# Patient Record
Sex: Female | Born: 1970 | Hispanic: Yes | Marital: Single | Smoking: Never smoker
Health system: Southern US, Community
[De-identification: ages and names within clinical notes are randomized; demographics above are authoritative.]

## PROBLEM LIST (undated history)

## (undated) DIAGNOSIS — I10 Essential (primary) hypertension: Secondary | ICD-10-CM

---

## 2006-09-08 ENCOUNTER — Observation Stay: Payer: Self-pay

## 2007-01-01 ENCOUNTER — Inpatient Hospital Stay: Payer: Self-pay | Admitting: Obstetrics and Gynecology

## 2010-05-06 ENCOUNTER — Encounter: Payer: Self-pay | Admitting: Obstetrics and Gynecology

## 2010-06-26 ENCOUNTER — Observation Stay: Payer: Self-pay | Admitting: Obstetrics and Gynecology

## 2010-09-28 ENCOUNTER — Inpatient Hospital Stay: Payer: Self-pay | Admitting: Obstetrics and Gynecology

## 2011-12-15 ENCOUNTER — Encounter: Payer: Self-pay | Admitting: Obstetrics & Gynecology

## 2011-12-26 ENCOUNTER — Encounter: Payer: Self-pay | Admitting: Obstetrics and Gynecology

## 2012-01-26 ENCOUNTER — Encounter: Payer: Self-pay | Admitting: Obstetrics and Gynecology

## 2012-02-23 ENCOUNTER — Encounter: Payer: Self-pay | Admitting: Maternal and Fetal Medicine

## 2012-03-22 ENCOUNTER — Encounter: Payer: Self-pay | Admitting: Maternal and Fetal Medicine

## 2014-12-28 NOTE — Consult Note (Signed)
Referral Information:   Reason for Referral Chronic Hypertension on labetolol in pregnancy now 24 1/7 here for follow up - additionally  Advanced Maternal Age, grand multiparity and depression    Referring Physician [facility]as Real[facility]les [facility]    Prenatal Hx Ms. Chip BoerVilleda Walter is a 44 year-old G5 P4004 at 3124 1/7 weeks who is referred for follow up of chronic hypertension in pregnancy. She increased her labetolol to 200 tid at the last visit and reports occasionally feeling dizzy after taking it if she hasn't eaten. She reports taking breakfast lunch and dinner soemtimes having a dose within 4 hours of the last one. She has had hypertension for approximately 3 years and denies evidence of end-organ dysfunction. She did well in her last pregnancy and did not develop preeclampsia.   Adv mat age-She had a first tirmester ultrasound performed [facility] on 10/17/11 which demonstrated an NT measurement of 1.9 mm and measurements consistent with her LMP.  She declined a first trimester screen.    She also has depression that is well-controlled on zoloft 50 mg qd  Pt seen with an interpreter.    Past Obstetrical Hx G5 P4004: 1994: spontaneous vaginal delivery at 7040 weeks, female, 3.5kg in [place], no complications 2001: spontaneous vaginal delivery at 40 weeks, female, 7+ pounds, [facility], no complications 2002: spontaneous vaginal delivery at 40 weeks, female, 7+ pounds, [facility], no complications 2008: spontaneous vaginal delivery at 38 3/7 weeks after SROM, female, 8+ pounds, [facility], chronic hypertension without preeclampsia   Home Medications: Medication Instructions Status  Zoloft 25 mg oral tablet 2 tab(s) orally once a day Active  Prenatal Multivitamins oral tablet 1 tab(s) orally once a day Active  aspirin 81 mg oral tablet 1 tab(s) orally once a day Active  labetalol 200 mg oral tablet 1 tab(s) orally every 8 hours Active   Allergies:   NKA: None  Vital Signs/Notes:  Nursing Vital  Signs: **Vital Signs.:   22-Apr-13 10:32   Vital Signs Type Routine   Temperature Temperature (F) 97.3   Celsius 36.2   Temperature Source oral   Pulse Pulse 85   Pulse source per Dinamap   Respirations Respirations 12   Systolic BP Systolic BP 140   Diastolic BP (mmHg) Diastolic BP (mmHg) 76   Mean BP 97   BP Source Dinamap; rt arm pt sitting large cuff pt state compliant w/BP medication regimen    10:34   Vital Signs Type Recheck   Pulse Pulse 86   Pulse source per Dinamap   Respirations Respirations 12   Systolic BP Systolic BP 142   Diastolic BP (mmHg) Diastolic BP (mmHg) 72   Mean BP 95   BP Source Dinamap; lf arm large cuff pt sitting   Perinatal Consult:   PGyn Hx Denies history of abnormal paps or STDs    Past Medical History cont'd Hypertension diagnosed in 2008 during pregnancy, denies end-organ dysfunction Depression    PSurg Hx Denies    FHx Denies FH of birth defects, MR or genetic disorders    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 occ HA while working over stove at work, blurry vision. No abdominal pain, cramping or bleeding.    Fever/Chills No    Cough No    Abdominal Pain No    Diarrhea No    Constipation No    Nausea/Vomiting No  SOB/DOE No    Chest Pain No    Tolerating Diet Yes  some nausea with vitamins    Medications/Allergies Reviewed Medications/Allergies reviewed     Additional Lab/Radiology Notes Prenatal Labs: Phineas Real, 10/05/11): Blood type A positive, antibody screen negative, Rubella immune, Pap normal, RPR non-reactive, GC/Chl negative, Heb P negative, HIVnon-reactive, Early glucola 132, MMS normal (DS risk 1 in 1330, ONTD risk 1 in 9460, Tri 18 risk 1 in 10,900).  Urine P/C (in [facility] consult report dated 10/25/11): 120 mg   Impression/Recommendations:   Impression 1-IUP at 24 1/7 weeks  2-chronic hypertension on  labetolol 200 tid and asa 81 mg , p/c 120 mg BP in 140/70 range - increased risk of preeclampsia due to h/o HTN   3-AGE 77 Advanced Maternal Age,  Her detailed ultrasound was normal and her maternal multiple marker screen was also normal. She declined genetic counseling,  4-depression stable on Zoloft 50 mg/day 5- Grand multiparity higher risk of post partum hemorrhage  , pt desires permanent sterilization and if not available LARC method mirena or nexplanon acceptable to her -will accept blood transfusion , pt notes she had a prolonged stay at the last delivery due to increased  EBL.    Recommendations 1- Chronic Hypertension.  Continue labetolol 200 tid - I advised her to space out her doses to q 8h - we discussed strategies to get her midday  dose in. Continue ASA 81 mg. If her BP increases above 160/90 increased her labetolol by 100 mg increments (by  /day to a total of 800 mg tid ) We will be happy to see her again if increasing meds are required . We also reviewed signs and symptoms of preeclampsia.  2. Low-lying placenta: No bleeding. We will reasses placenta location at 28 weeks. on 5/23 - recheck BP then  3- Desires sterilization - I suggested she inquire about signing Medicaid  tubal papers and inquiring on her coverage or if  in case emergent cesarean is required 4--Continue Zoloft. Alert Peds at delivery. Screen for postpartum depression following delivery   Plan:   Ultrasound at what gestational ages Monthly > 28 weeks, scheduled on 5/23    Antepartum Testing Twice weekly, Starting at 35 to 36 weeks    Delivery Mode Vaginal    Additional Testing glucola    Delivery at what gestational age [redacted] weeks     Comments .     Total Time Spent with Patient 15 minutes    >50% of visit spent in couseling/coordination of care yes    Office Use Only 99213  Office Visit Level 3 ( ) EST exp prob focused outpt   Coding Description: MATERNAL CONDITIONS/HISTORY INDICATION(S).    HTN - Chronic.  Electronic Signatures: Rondall Allegra (MD)  (Signed 22-Apr-13 12:53)  Authored: Referral, Home Medications, Allergies, Vital Signs/Notes, Consult, Exam, Lab/Radiology Notes, Impression, Plan, Other Comments, Billing, Coding Description   Last Updated: 22-Apr-13 12:53 by Rondall Allegra (MD)

## 2014-12-28 NOTE — Consult Note (Signed)
Referral Information:   Reason for Referral Pregnancy in setting of Advanced Maternal Age, depression and chronic hypertension.    Referring Physician Phineas Real [facility]    Prenatal Hx [name] is a 44 year-old G5 P4004 at 57 1/7 weeks who is referred for recommendations on the management of Advanced Maternal Age, depression and chronic hypertension in pregnancy.  She had a first tirmester ultrasound performed at [facility] on 10/17/11 which demonstrated an NT measurement of 1.9 mm and measurements consistent with her LMP.  She declined a first trimester screen.  Ms. was first diagnosed with hypertension prior to her last pregnancy.  She states that she took an anti-hypertensive agent during pregnancy (can't remember the name) and then was transitioned to labetalol (100 mg twice daily) postpartum. She denies history of end-organ dysfunction and has no complaints.   She also has depression that is well-controlled on zoloft   Pt seen with an interpreter.    Past Obstetrical Hx G5 P4004: 1994: spontaneous vaginal delivery at 5 weeks, female, 3.5kg in [place], no complications 2001: spontaneous vaginal delivery at 40 weeks, female, 7+ pounds, [facility], no complications 2002: spontaneous vaginal delivery at 40 weeks, female, 7+ pounds, [facility], no complications 2008: spontaneous vaginal delivery at 38 3/7 weeks after SROM, female, 8+ pounds, [facility], chronic hypertension without preeclampsia   Home Medications: Medication Instructions Status  labetalol 100 mg tablet 1 tab(s) orally 2 times a day  Active  ferrous sulfate 325 mg tablet 1 tab(s) orally once a day for 6 weeks Active  Zoloft 25 mg oral tablet 2 tab(s) orally once a day Active  Prenatal Multivitamins oral tablet 1 tab(s) orally once a day Active  aspirin 81 mg oral tablet 1 tab(s) orally once a day Active   Allergies:   NKA: None  Vital Signs/Notes:  Nursing Vital Signs: **Vital Signs.:   11-Apr-13 10:19   Pulse Pulse  93   Systolic BP Systolic BP 159   Diastolic BP (mmHg) Diastolic BP (mmHg) 77    10:21   Pulse Pulse 91   Systolic BP Systolic BP 160   Diastolic BP (mmHg) Diastolic BP (mmHg) 75   Perinatal Consult:   PGyn Hx Denies history of abnormal paps or STDs    Past Medical History cont'd Hypertension diagnosed in 2008 during pregnancy, denies end-organ dysfunction Depression    PSurg Hx Denies    FHx Denies FH of birth defects, MR or genetic disorders    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 No HA, blurry vision. No abdominal pain, cramping or bleeding.    Fever/Chills No    Cough No    Abdominal Pain No    Diarrhea No    Constipation No    Nausea/Vomiting No    SOB/DOE No    Chest Pain No    Tolerating Diet Yes    Medications/Allergies Reviewed Medications/Allergies reviewed  Excpet states that she is taking 200 mg twice a day     Additional Lab/Radiology Notes Prenatal Labs: Phineas Real, 10/05/11): Blood type A positive, antibody screen negative, Rubella immune, Pap normal, RPR non-reactive, GC/Chl negative, Heb P negative, HIVnon-reactive, Early glucola 132, MMS normal (DS risk 1 in 1330, ONTD risk 1 in 9460, Tri 18 risk 1 in 10,900).  Urine P/C (in [facility] consult report dated 10/25/11): 120 mg   Impression/Recommendations:   Impression 44 year-old G5 P4004 at  22 1/7 weeks with pregnancy complicated by Advanced Maternal Age, depression and chronic hypertension.    Recommendations 1. Advanced Maternal Age.  We discussed the age-associated risk of aneuploidy and diagnostic tests that are available. We also discussed risks of medical complications of pregnancy such as diabetes and preeclampsia.  Her detailed ultrasound was normal and her maternal multiple marker screen was also normal. She declined genetic counseling, amniocentesis, and cell-free fetal DNA testing.  2.  Depression. [name] and her symptoms have been well controlled. She has had depression for over 4-5 years and has done well following her delivieries. We discussed risk fo fetal SSRI withdrawal at time of delivery.  Please alert the pediatricians at time of delivery concerning Zoloft exposure.  She seen shortly after delivery to screen for signs of postpartum depression.  3. Chronic Hypertension.  She has had hypertension for approximately 3 years and denies evidence of end-organ dysfunction. She did well in her last pregnancy and did not develop preeclampsia.  She is currently tolerating Labetalol 200 mg every 12 hours but her pressures are a bit elevated today.  She checks her own BP about twice a week when she goes to [employer].  We discussed risk of fetal growth restriction, preeclampsia and other complications as well as the importance of maintaining a normal blood pressure.  We also reviewed signs and symptoms of preeclampsia.  4. Low-lying placenta: No bleeding. We will reasses placenta location at 28 weeks.     Comments Recommendations: -Declines genetic counseling, amniocentesis and cell-free fetal DNA testing -Continue Zoloft. Alert Peds at delivery. Screen for postpartum depression following delivery -Increase Labetalol to 200 mg every 8 hours (a script was given).  Can increase labetalol as needed to a max of 800 mg every 8 hours (2400 mg daily) as long as tolerated.  Want to keep pressure less than 160/90. -Continue 81 mg ASA daily -Growth scan at 28 weeks and then monthly -Fetal testing to start at 32 weeks -Delivery at 39 weeks as long as maternal/fetal status stable -RTC 2 wks for BP check -RTC 6 wks for US (growth and placental location) -Please obtain baseline LFTs, platelet count and LFTs if not done already -Please obatin baseline EKG if not done already -Urine P/C documented as 120 mg.     Total Time Spent with Patient 60 minutes    >50% of visit  spent in couseling/coordination of care yes    Office Use Only 99244  Level 4 (60min) NEW office consult low complexity   Coding Description: MATERNAL CONDITIONS/HISTORY INDICATION(S).   Adv. Maternal Age - multigravida.   Chronic HTN.   Drug Use:  SSRI's (Paxil, Zoloft, Prozac, Celexa, Cymbalta).  Electronic Signatures: Gagandeep Kossman, [place] (MD)  (Signed 11-Apr-13 12:30)  Authored: Referral, Home Medications, Allergies, Vital Signs/Notes, Consult, Exam, Lab/Radiology Notes, Impression, Other Comments, Billing, Coding Description   Last Updated: 11-Apr-13 12:30 by Avonne Berkery, [place] (MD)

## 2015-09-20 ENCOUNTER — Emergency Department: Payer: Self-pay

## 2015-09-20 ENCOUNTER — Emergency Department
Admission: EM | Admit: 2015-09-20 | Discharge: 2015-09-20 | Disposition: A | Discharge status: Home / Self Care | Payer: Self-pay | Attending: Emergency Medicine | Admitting: Emergency Medicine

## 2015-09-20 ENCOUNTER — Encounter: Payer: Self-pay | Admitting: Emergency Medicine

## 2015-09-20 DIAGNOSIS — R109 Unspecified abdominal pain: Secondary | ICD-10-CM

## 2015-09-20 DIAGNOSIS — N23 Unspecified renal colic: Secondary | ICD-10-CM | POA: Insufficient documentation

## 2015-09-20 DIAGNOSIS — I10 Essential (primary) hypertension: Secondary | ICD-10-CM | POA: Insufficient documentation

## 2015-09-20 DIAGNOSIS — N200 Calculus of kidney: Secondary | ICD-10-CM | POA: Insufficient documentation

## 2015-09-20 DIAGNOSIS — Z79899 Other long term (current) drug therapy: Secondary | ICD-10-CM | POA: Insufficient documentation

## 2015-09-20 HISTORY — DX: Essential (primary) hypertension: I10

## 2015-09-20 LAB — COMPREHENSIVE METABOLIC PANEL
ALBUMIN: 4.2 g/dL (ref 3.5–5.0)
ALK PHOS: 57 U/L (ref 38–126)
ALT: 27 U/L (ref 14–54)
AST: 25 U/L (ref 15–41)
Anion gap: 6 (ref 5–15)
BUN: 18 mg/dL (ref 6–20)
CALCIUM: 8.9 mg/dL (ref 8.9–10.3)
CO2: 24 mmol/L (ref 22–32)
CREATININE: 0.59 mg/dL (ref 0.44–1.00)
Chloride: 104 mmol/L (ref 101–111)
GFR calc non Af Amer: >60 mL/min (ref >60)
GLUCOSE: 149 mg/dL — AB (ref 65–99)
Potassium: 3.5 mmol/L (ref 3.5–5.1)
SODIUM: 134 mmol/L — AB (ref 135–145)
Total Bilirubin: 0.3 mg/dL (ref 0.3–1.2)
Total Protein: 7.5 g/dL (ref 6.5–8.1)

## 2015-09-20 LAB — URINALYSIS COMPLETE WITH MICROSCOPIC (ARMC ONLY)
Bilirubin Urine: NEGATIVE
GLUCOSE, UA: NEGATIVE mg/dL
Ketones, ur: NEGATIVE mg/dL
Leukocytes, UA: NEGATIVE
NITRITE: NEGATIVE
Protein, ur: NEGATIVE mg/dL
SPECIFIC GRAVITY, URINE: 1.013 (ref 1.005–1.030)
pH: 6 (ref 5.0–8.0)

## 2015-09-20 LAB — CBC WITH DIFFERENTIAL/PLATELET
BASOS PCT: 1 %
Basophils Absolute: 0.1 10*3/uL (ref 0–0.1)
EOS ABS: 0 10*3/uL (ref 0–0.7)
EOS PCT: 0 %
HCT: 40.8 % (ref 35.0–47.0)
Hemoglobin: 13.6 g/dL (ref 12.0–16.0)
Lymphocytes Relative: 8 %
Lymphs Abs: 1.1 10*3/uL (ref 1.0–3.6)
MCH: 29.7 pg (ref 26.0–34.0)
MCHC: 33.5 g/dL (ref 32.0–36.0)
MCV: 88.5 fL (ref 80.0–100.0)
MONO ABS: 0.6 10*3/uL (ref 0.2–0.9)
MONOS PCT: 5 %
Neutro Abs: 12.3 10*3/uL — ABNORMAL HIGH (ref 1.4–6.5)
Neutrophils Relative %: 86 %
Platelets: 285 10*3/uL (ref 150–440)
RBC: 4.6 MIL/uL (ref 3.80–5.20)
RDW: 13.1 % (ref 11.5–14.5)
WBC: 14.2 10*3/uL — ABNORMAL HIGH (ref 3.6–11.0)

## 2015-09-20 LAB — LIPASE, BLOOD: LIPASE: 25 U/L (ref 11–51)

## 2015-09-20 MED ORDER — ONDANSETRON HCL 4 MG PO TABS: 4.0000 mg | ORAL_TABLET | Freq: Three times a day (TID) | ORAL | Status: AC | PRN

## 2015-09-20 MED ORDER — TAMSULOSIN HCL 0.4 MG PO CAPS: 0.4000 mg | ORAL_CAPSULE | Freq: Every day | ORAL | Status: AC

## 2015-09-20 MED ORDER — ONDANSETRON HCL 4 MG/2ML IJ SOLN
4.0000 mg | Freq: Once | INTRAMUSCULAR | Status: AC
  Administered 2015-09-20: 4 mg via INTRAVENOUS
  Filled 2015-09-20: qty 2

## 2015-09-20 MED ORDER — IBUPROFEN 800 MG PO TABS: 800.0000 mg | ORAL_TABLET | Freq: Three times a day (TID) | ORAL | Status: DC

## 2015-09-20 MED ORDER — HYDROMORPHONE HCL 2 MG PO TABS: 2.0000 mg | ORAL_TABLET | Freq: Two times a day (BID) | ORAL | Status: DC | PRN

## 2015-09-20 MED ORDER — KETOROLAC TROMETHAMINE 30 MG/ML IJ SOLN
15.0000 mg | Freq: Once | INTRAMUSCULAR | Status: AC
  Administered 2015-09-20: 15 mg via INTRAVENOUS
  Filled 2015-09-20: qty 1

## 2015-09-20 NOTE — ED Notes (Signed)
Pain 6/10.  Skin w/d, NAD.  See med note.

## 2015-09-20 NOTE — ED Notes (Signed)
Used spanish interpreter for discharge.

## 2015-09-20 NOTE — ED Notes (Signed)
Pt. Also states burning sensation in vagina for the past 3 weeks.  Pt. States using OTC cream that has given some relief, but burning sensation returns.

## 2015-09-20 NOTE — ED Notes (Signed)
Pt. States lower abd. Pain that radiates to the back on lt. Side. Pt. States she has vommitted twice in the past 2 hours.  Pt. Denies similar pain in the past.

## 2015-09-20 NOTE — ED Provider Notes (Signed)
Time Seen: Approximately ----------------------------------------- 7:36 AM on 09/20/2015 -----------------------------------------   I have reviewed the triage notes  Chief Complaint: Abdominal Pain   History of Present Illness: Carla Walter is a 45 y.o. female who states the burning sensation and pain in the left lower quadrant toward the left flank area. Patient states the pain started acutely at 3 AM. He has noted had it occurring over the last 3 weeks but the patient denies this and states it started suddenly this morning. She states she feels more comfortable lying on her left side. She had some nausea and vomited 2 no blood or bile. She denies any significant nausea at this time. Etc. she noticed some burning with urination with no fever at home. She is not aware of any hematuria. She denies any difficulty with bowel function with no melena or hematochezia. Patient's primarily Spanish-speaking but understands English and her husband is also assisting with translation.   Past Medical History  Diagnosis Date  . Hypertension     There are no active problems to display for this patient.   History reviewed. No pertinent past surgical history.  History reviewed. No pertinent past surgical history.  Current Outpatient Rx  Name  Route  Sig  Dispense  Refill  . lisinopril-hydrochlorothiazide (PRINZIDE,ZESTORETIC) 20-12.5 MG tablet   Oral   Take 2 tablets by mouth daily.           Allergies:  Review of patient's allergies indicates no known allergies.  Family History: History reviewed. No pertinent family history.  Social History: Social History  Substance Use Topics  . Smoking status: Never Smoker   . Smokeless tobacco: None  . Alcohol Use: No     Review of Systems:   10 point review of systems was performed and was otherwise negative:  Constitutional: No fever Eyes: No visual disturbances ENT: No sore throat, ear pain Cardiac: No chest  pain Respiratory: No shortness of breath, wheezing, or stridor Abdomen: Left lower quadrant with pain radiating to the left flank Endocrine: No weight loss, No night sweats Extremities: No peripheral edema, cyanosis Skin: No rashes, easy bruising Neurologic: No focal weakness, trouble with speech or swollowing Urologic: No dysuria, Hematuria, or urinary frequency   Physical Exam:  ED Triage Vitals  Enc Vitals Group     BP 09/20/15 0605 166/78 mmHg     Pulse Rate 09/20/15 0605 92     Resp 09/20/15 0605 20     Temp 09/20/15 0605 98.6 F (37 C)     Temp src --      SpO2 09/20/15 0605 96 %     Weight 09/20/15 0605 160 lb (72.576 kg)     Height 09/20/15 0605 5\' 4"  (1.626 m)     Head Cir --      Peak Flow --      Pain Score 09/20/15 0606 6     Pain Loc --      Pain Edu? --      Excl. in GC? --     General: Awake , Alert , and Oriented times 3; GCS 15 Head: Normal cephalic , atraumatic Eyes: Pupils equal , round, reactive to light Nose/Throat: No nasal drainage, patent upper airway without erythema or exudate.  Neck: Supple, Full range of motion, No anterior adenopathy or palpable thyroid masses Lungs: Clear to ascultation without wheezes , rhonchi, or rales Heart: Regular rate, regular rhythm without murmurs , gallops , or rubs Abdomen: Soft, non tender without rebound, guarding ,  or rigidity; bowel sounds positive and symmetric in all 4 quadrants. No organomegaly .   Mild tenderness in the left flank area     Extremities: 2 plus symmetric pulses. No edema, clubbing or cyanosis Neurologic: normal ambulation, Motor symmetric without deficits, sensory intact Skin: warm, dry, no rashes   Labs:   All laboratory work was reviewed including any pertinent negatives or positives listed below:  Labs Reviewed  URINALYSIS COMPLETEWITH MICROSCOPIC ([facility] ONLY) - Abnormal; Notable for the following:    Color, Urine STRAW (*)    APPearance CLEAR (*)    Hgb urine dipstick 2+ (*)     Bacteria, UA FEW (*)    Squamous Epithelial / LPF 0-5 (*)    All other components within normal limits  COMPREHENSIVE METABOLIC PANEL - Abnormal; Notable for the following:    Sodium 134 (*)    Glucose, Bld 149 (*)    All other components within normal limits  CBC WITH DIFFERENTIAL/PLATELET - Abnormal; Notable for the following:    WBC 14.2 (*)    Neutro Abs 12.3 (*)    All other components within normal limits  LIPASE, BLOOD   review of laboratory work showed hematuria    Radiology:   EXAM: CT ABDOMEN AND PELVIS WITHOUT CONTRAST  TECHNIQUE: Multidetector CT imaging of the abdomen and pelvis was performed following the standard protocol without IV contrast.  COMPARISON: None.  FINDINGS: Lung bases are within normal.  Abdominal images demonstrating normal liver, spleen, pancreas, gallbladder and adrenal glands. Stomach is within normal. There is mild calcified plaque over the abdominal aorta.  Kidneys are normal in size. There is a 2 mm stone over the lower pole of the left intrarenal collecting system. There is mild left-sided hydronephrosis and dilatation of the left ureter as there is a 7 mm stone over the distal left ureter approximately 4 cm above the UVJ causing this low-grade obstruction. No right-sided renal stones. Right ureter is within normal. Several small bilateral renal cysts.  Appendix is normal. Colon small bowel are within normal. The mesentery is normal. There is no free fluid. Retro aortic left renal vein.  Pelvic images demonstrate an IUD in adequate position over the uterine fundus. Bladder, ovaries and rectum are within normal. Minimal degenerate change of the spine.  IMPRESSION: Left-sided nephrolithiasis. 7 mm stone over the distal left ureter causing low-grade obstruction.  Bilateral renal cysts.    I personally reviewed the radiologic studies     ED Course: Differential diagnosis includes but is not exclusive to ovarian cyst,  ovarian torsion, acute appendicitis, urinary tract infection, endometriosis, bowel obstruction, colitis, renal colic, gastroenteritis, etc. Given the patient's current presentation and objective findings appears that she has a large left-sided kidney stone. Patient's pain was controlled with Toradol and Zofran I felt she could be treated on an outpatient basis. Through interpreter we describes the kidney stone etc. and the prescriptions that she will be given for home for pain control etc.    Assessment:  Acute renal colic   Final Clinical Impression: * * Final diagnoses:  Left flank pain     Plan: * Outpatient management Follow-up urology Patient was advised to return immediately if condition worsens. Patient was advised to follow up with their primary care physician or other specialized physicians involved in their outpatient care             Jennye Moccasin, MD 09/20/15 1017

## 2021-05-19 ENCOUNTER — Other Ambulatory Visit: Payer: Self-pay

## 2021-05-19 ENCOUNTER — Ambulatory Visit
Admission: EM | Admit: 2021-05-19 | Discharge: 2021-05-19 | Disposition: A | Discharge status: Home / Self Care | Payer: Self-pay | Attending: Internal Medicine | Admitting: Internal Medicine

## 2021-05-19 ENCOUNTER — Ambulatory Visit (INDEPENDENT_AMBULATORY_CARE_PROVIDER_SITE_OTHER): Payer: Self-pay

## 2021-05-19 DIAGNOSIS — M25562 Pain in left knee: Secondary | ICD-10-CM

## 2021-05-19 DIAGNOSIS — S83412A Sprain of medial collateral ligament of left knee, initial encounter: Secondary | ICD-10-CM

## 2021-05-19 MED ORDER — IBUPROFEN 600 MG PO TABS: 600.0000 mg | ORAL_TABLET | Freq: Four times a day (QID) | ORAL | 0 refills | Status: AC | PRN

## 2021-05-19 NOTE — ED Triage Notes (Signed)
Pt reports having L knee pain x2 weeks. Denies injury to knee. Sts when walking she can hear her knee pop and it is more painful when walking.

## 2021-05-19 NOTE — Discharge Instructions (Signed)
Gentle range of motion exercises Icing of the left knee Take medications as prescribed Return to urgent care if symptoms worsen.

## 2021-05-19 NOTE — ED Provider Notes (Signed)
[facility]    CSN: [id number] Arrival date & time: 05/19/21  1044      History   Chief Complaint Chief Complaint  Patient presents with   Knee Pain    HPI Carla Walter is a 50 y.o. female the urgent care with left knee pain of 1 month duration.  Patient denies any fall or trauma to the left knee.  Symptom onset was insidious and has been progressively worse.  Currently pain is moderate in severity.  Pain is aggravated by walking and is worse at the end of workday.  Resting helps the knee pain somewhat.  It is associated with swelling patient denies any bruising.  She feels a clicking sound in the knee on occasion.  No fever or chills.  No URI symptoms or dysuria.Marland Kitchen   HPI  Past Medical History:  Diagnosis Date   Hypertension     There are no problems to display for this patient.   History reviewed. No pertinent surgical history.  OB History   No obstetric history on file.      Home Medications    Prior to Admission medications   Medication Sig Start Date End Date Taking? Authorizing Provider  HYDROmorphone (DILAUDID) 2 MG tablet Take 1 tablet (2 mg total) by mouth every 12 (twelve) hours as needed for severe pain. 09/20/15   Jennye Moccasin, MD  ibuprofen (ADVIL,MOTRIN) 800 MG tablet Take 1 tablet (800 mg total) by mouth 3 (three) times daily. 09/20/15   Jennye Moccasin, MD  lisinopril-hydrochlorothiazide (PRINZIDE,ZESTORETIC) 20-12.5 MG tablet Take 2 tablets by mouth daily.    [provider]  ondansetron (ZOFRAN) 4 MG tablet Take 1 tablet (4 mg total) by mouth every 8 (eight) hours as needed for nausea or vomiting. 09/20/15   Jennye Moccasin, MD    Family History History reviewed. No pertinent family history.  Social History Social History   Tobacco Use   Smoking status: Never   Smokeless tobacco: Never  Substance Use Topics   Alcohol use: No   Drug use: Never     Allergies   Patient has no known allergies.   Review of  Systems Review of Systems  Gastrointestinal: Negative.   Musculoskeletal:  Positive for arthralgias. Negative for joint swelling and myalgias.  Skin:  Negative for color change and wound.  Neurological: Negative.     Physical Exam Triage Vital Signs ED Triage Vitals  Enc Vitals Group     BP 05/19/21 1131 (!) 170/88     Pulse Rate 05/19/21 1131 68     Resp 05/19/21 1131 18     Temp 05/19/21 1131 97.9 F (36.6 C)     Temp Source 05/19/21 1131 Oral     SpO2 05/19/21 1131 96 %     Weight 05/19/21 1134 170 lb (77.1 kg)     Height --      Head Circumference --      Peak Flow --      Pain Score 05/19/21 1133 10     Pain Loc --      Pain Edu? --      Excl. in GC? --    No data found.  Updated Vital Signs BP (!) 170/88   Pulse 68   Temp 97.9 F (36.6 C) (Oral)   Resp 18   Wt 77.1 kg   SpO2 96%   BMI 29.18 kg/m   Visual Acuity Right Eye Distance:   Left Eye Distance:  Bilateral Distance:    Right Eye Near:   Left Eye Near:    Bilateral Near:     Physical Exam Vitals and nursing note reviewed.  Constitutional:      General: She is not in acute distress.    Appearance: She is not ill-appearing.  Cardiovascular:     Rate and Rhythm: Normal rate and regular rhythm.  Musculoskeletal:        General: Tenderness present. Normal range of motion.     Comments: Tenderness on palpation over the medial collateral ligament of the left knee.  Anterior and posterior drawer test are negative.  Neurological:     Mental Status: She is alert.     UC Treatments / Results  Labs (all labs ordered are listed, but only abnormal results are displayed) Labs Reviewed - No data to display  EKG   Radiology No results found.  Procedures Procedures (including critical care time)  Medications Ordered in UC Medications - No data to display  Initial Impression / Assessment and Plan / UC Course  I have reviewed the triage vital signs and the nursing notes.  Pertinent labs &  imaging results that were available during my care of the patient were reviewed by me and considered in my medical decision making (see chart for details).     1.  Left knee sprain: Left knee brace Ibuprofen as needed for pain X-ray of the left knee is negative for acute fracture. Gentle range of motion exercises Please ice your knee after long days work If pain persist you will benefit from sports medicine evaluation. Return to urgent care if symptoms worsen. Final Clinical Impressions(s) / UC Diagnoses   Final diagnoses:  None   Discharge Instructions   None    ED Prescriptions   None    PDMP not reviewed this encounter.   Merrilee Jansky, MD 05/19/21 1239

## 2021-07-14 ENCOUNTER — Other Ambulatory Visit: Payer: Self-pay

## 2021-07-14 ENCOUNTER — Ambulatory Visit
Admission: RE | Admit: 2021-07-14 | Discharge: 2021-07-14 | Disposition: A | Discharge status: Home / Self Care | Payer: Self-pay | Source: Ambulatory Visit | Attending: Oncology | Admitting: Oncology

## 2021-07-14 ENCOUNTER — Ambulatory Visit: Payer: Self-pay | Attending: Oncology

## 2021-07-14 VITALS — BP 169/91 | HR 81 | Temp 97.4°F | Wt 174.0 lb

## 2021-07-14 DIAGNOSIS — Z0000 Encounter for general adult medical examination without abnormal findings: Secondary | ICD-10-CM | POA: Insufficient documentation

## 2021-07-14 NOTE — Progress Notes (Signed)
Sent for bilateral screening mammogram.  Letter mailed from [facility] to notify of normal mammogram results.  Patient to return in one year for annual screening.  Copy to [facility]. 

## 2021-07-14 NOTE — Progress Notes (Signed)
  Subjective:     Patient ID: Carla Walter, female   DOB: 1971/03/02, 50 y.o.   MRN: [id number]  HPI   Review of Systems     Objective:   Physical Exam Chest:  Breasts:    Right: No swelling, bleeding, inverted nipple, mass, nipple discharge, skin change or tenderness.     Left: No swelling, bleeding, inverted nipple, mass, nipple discharge, skin change or tenderness.       Assessment:     50 year old Hispanic patient presents for [facility] clinic visit..  Patient screened, and meets [facility] eligibility.  Patient does not have insurance, Medicare or Medicaid.  Instructed patient on breast self awareness using teach back method.   Clinical breast exam unremarkable. No mass or lump palpated.     Plan:     Sent for bilateral screening mammogram.

## 2022-02-22 IMAGING — CR DG KNEE COMPLETE 4+V*L*
4 series · 4 of 4 positions shown · non-contrast
Comparison: None.

CLINICAL DATA: Knee pain

EXAM:
LEFT KNEE - COMPLETE 4+ VIEW

[knee ap]
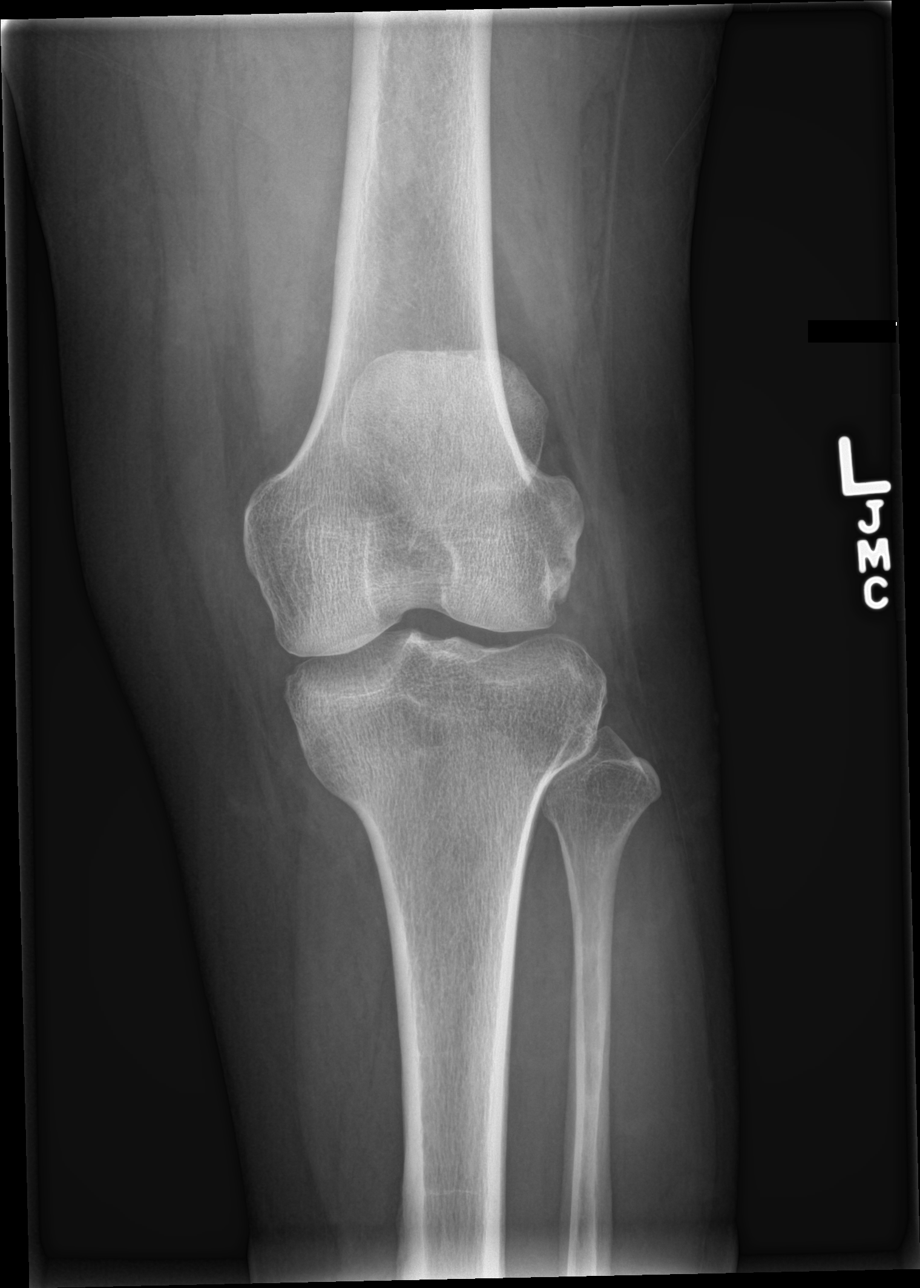

[knee lat]
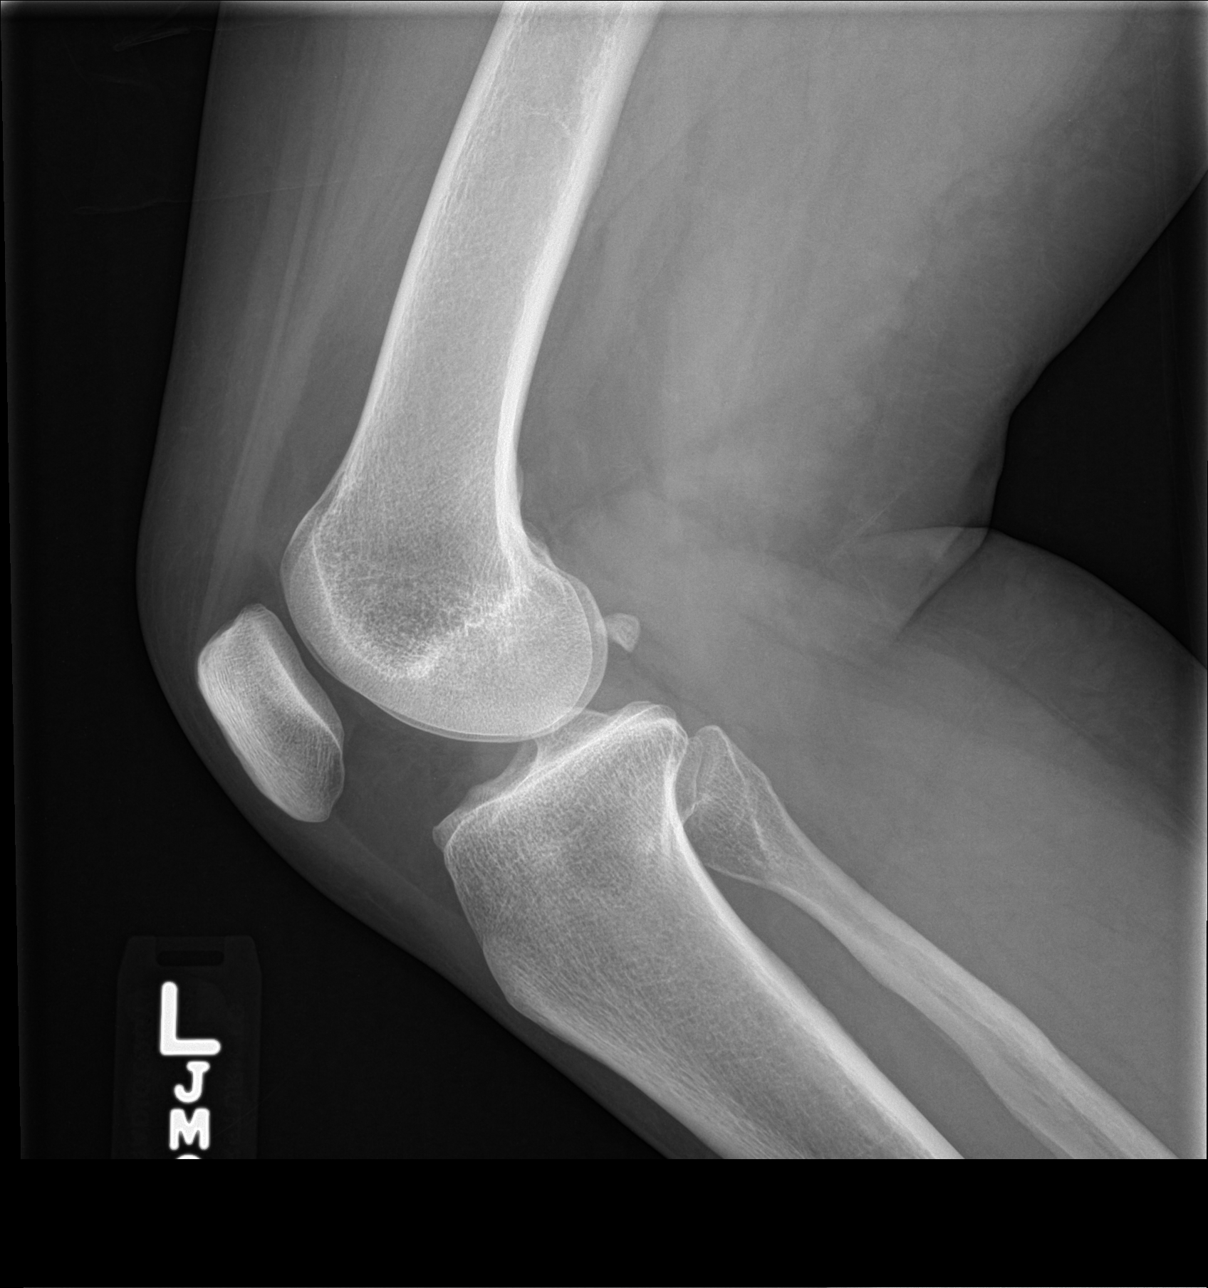

[tunnel]
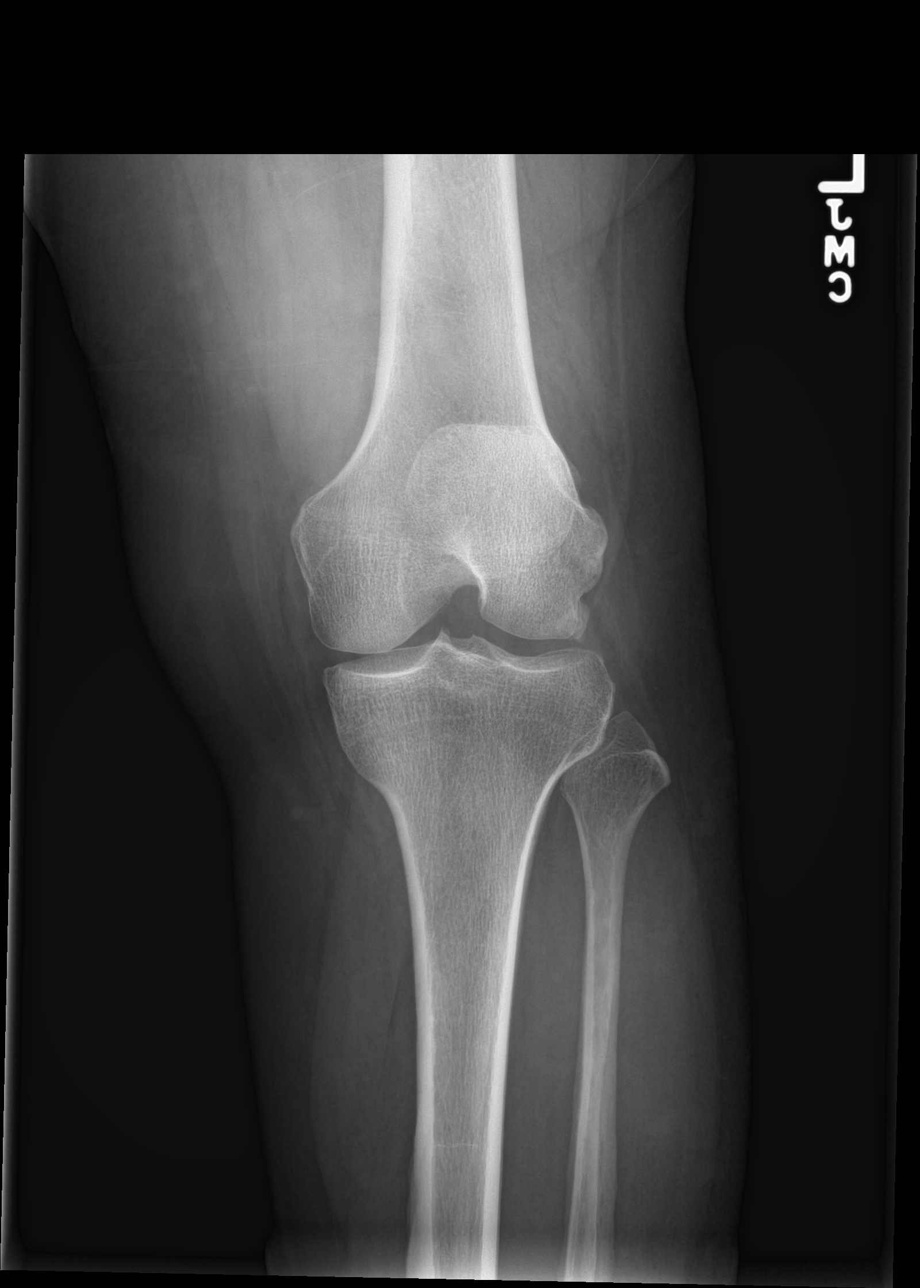

[patella skyline]
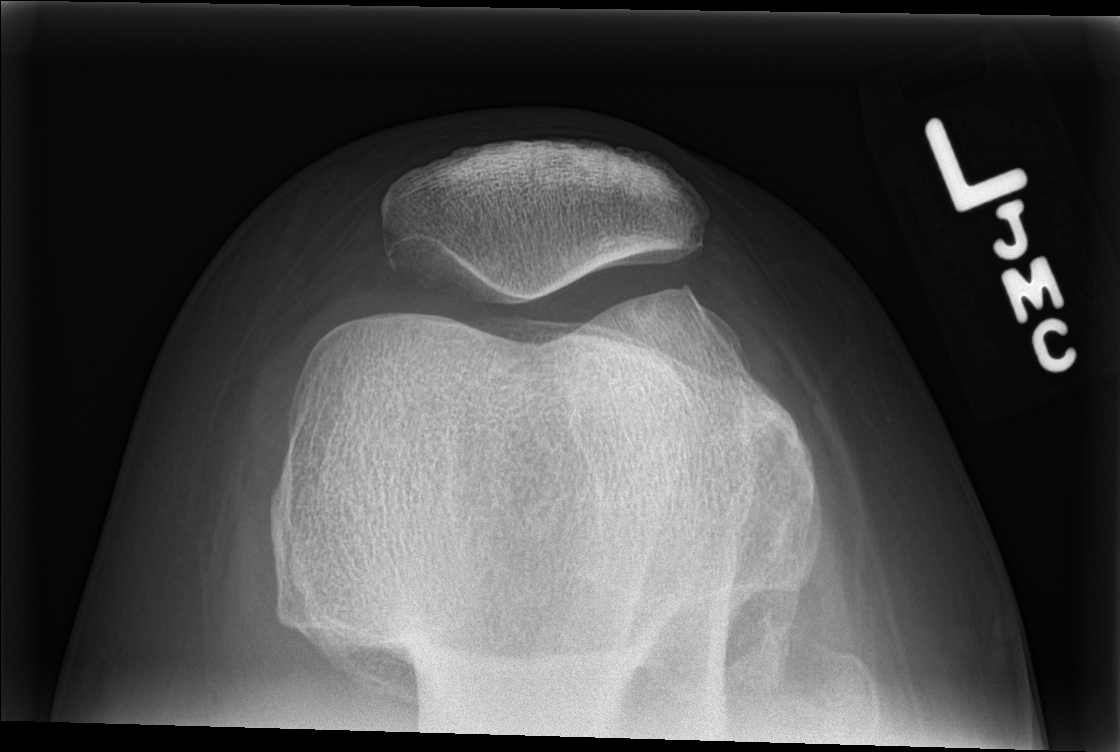

[4 of 4 positions shown; findings below may reference images not displayed]

FINDINGS: There is mild medial and patellofemoral compartment degenerative
change. No significant joint effusion. No evidence of acute fracture
or dislocation.
IMPRESSION: Mild osteoarthritis in the medial and patellofemoral compartments.

## 2024-07-18 ENCOUNTER — Telehealth: Payer: Self-pay
